# Patient Record
Sex: Male | Born: 1955 | Race: White | Hispanic: No | Smoking: Current some day smoker
Health system: Southern US, Community
[De-identification: ages and names within clinical notes are randomized; demographics above are authoritative.]

## PROBLEM LIST (undated history)

## (undated) DIAGNOSIS — K649 Unspecified hemorrhoids: Secondary | ICD-10-CM

## (undated) DIAGNOSIS — K5792 Diverticulitis of intestine, part unspecified, without perforation or abscess without bleeding: Secondary | ICD-10-CM

## (undated) HISTORY — DX: Diverticulitis of intestine, part unspecified, without perforation or abscess without bleeding: K57.92

## (undated) HISTORY — DX: Unspecified hemorrhoids: K64.9

---

## 1998-07-16 HISTORY — PX: COLONOSCOPY: SHX174

## 2010-07-16 DIAGNOSIS — K649 Unspecified hemorrhoids: Secondary | ICD-10-CM

## 2010-07-16 DIAGNOSIS — K5792 Diverticulitis of intestine, part unspecified, without perforation or abscess without bleeding: Secondary | ICD-10-CM

## 2010-07-16 HISTORY — DX: Diverticulitis of intestine, part unspecified, without perforation or abscess without bleeding: K57.92

## 2010-07-16 HISTORY — DX: Unspecified hemorrhoids: K64.9

## 2011-02-26 ENCOUNTER — Ambulatory Visit: Payer: Self-pay | Admitting: Internal Medicine

## 2011-04-27 ENCOUNTER — Ambulatory Visit: Payer: Self-pay | Admitting: General Surgery

## 2011-04-30 LAB — PATHOLOGY REPORT

## 2012-11-07 ENCOUNTER — Emergency Department: Payer: Self-pay | Admitting: Emergency Medicine

## 2012-11-07 LAB — URINALYSIS, COMPLETE
Bilirubin,UR: NEGATIVE
Leukocyte Esterase: NEGATIVE
Nitrite: NEGATIVE
Ph: 7 (ref 4.5–8.0)
Protein: NEGATIVE
RBC,UR: 1 /HPF (ref 0–5)
WBC UR: 1 /HPF (ref 0–5)

## 2012-11-07 LAB — COMPREHENSIVE METABOLIC PANEL
Albumin: 3.8 g/dL (ref 3.4–5.0)
Alkaline Phosphatase: 99 U/L (ref 50–136)
BUN: 16 mg/dL (ref 7–18)
Calcium, Total: 9 mg/dL (ref 8.5–10.1)
Chloride: 107 mmol/L (ref 98–107)
EGFR (African American): >60
Potassium: 4.2 mmol/L (ref 3.5–5.1)
SGOT(AST): 25 U/L (ref 15–37)
SGPT (ALT): 28 U/L (ref 12–78)
Total Protein: 7.2 g/dL (ref 6.4–8.2)

## 2012-11-07 LAB — CBC
HCT: 47 % (ref 40.0–52.0)
HGB: 15.9 g/dL (ref 13.0–18.0)
Platelet: 238 10*3/uL (ref 150–440)

## 2012-11-07 LAB — LIPASE, BLOOD: Lipase: 139 U/L (ref 73–393)

## 2012-11-12 ENCOUNTER — Encounter: Payer: Self-pay | Admitting: *Deleted

## 2012-12-03 ENCOUNTER — Ambulatory Visit: Payer: Self-pay | Admitting: General Surgery

## 2012-12-11 ENCOUNTER — Encounter: Payer: Self-pay | Admitting: *Deleted

## 2016-10-24 ENCOUNTER — Encounter: Payer: Self-pay | Admitting: General Surgery

## 2017-05-08 ENCOUNTER — Ambulatory Visit
Admission: RE | Admit: 2017-05-08 | Payer: BLUE CROSS/BLUE SHIELD | Source: Ambulatory Visit | Admitting: Unknown Physician Specialty

## 2017-05-08 ENCOUNTER — Encounter: Admission: RE | Payer: Self-pay | Source: Ambulatory Visit

## 2017-05-08 SURGERY — COLONOSCOPY WITH PROPOFOL
Anesthesia: General

## 2017-09-03 ENCOUNTER — Telehealth: Payer: Self-pay | Admitting: *Deleted

## 2017-09-03 DIAGNOSIS — Z122 Encounter for screening for malignant neoplasm of respiratory organs: Secondary | ICD-10-CM

## 2017-09-03 DIAGNOSIS — Z87891 Personal history of nicotine dependence: Secondary | ICD-10-CM

## 2017-09-03 NOTE — Telephone Encounter (Signed)
Received referral for initial lung cancer screening scan. Contacted patient and obtained smoking history,(former, quit 1 year ago, 33 pack year) as well as answering questions related to screening process. Patient denies signs of lung cancer such as weight loss or hemoptysis. Patient denies comorbidity that would prevent curative treatment if lung cancer were found. Patient is scheduled for shared decision making visit and CT scan on 09/24/17.

## 2017-09-24 ENCOUNTER — Ambulatory Visit
Admission: RE | Admit: 2017-09-24 | Discharge: 2017-09-24 | Disposition: A | Discharge status: Home / Self Care | Payer: BLUE CROSS/BLUE SHIELD | Source: Ambulatory Visit | Attending: Oncology | Admitting: Oncology

## 2017-09-24 ENCOUNTER — Inpatient Hospital Stay: Payer: BLUE CROSS/BLUE SHIELD | Attending: Oncology | Admitting: Oncology

## 2017-09-24 ENCOUNTER — Encounter: Payer: Self-pay | Admitting: Oncology

## 2017-09-24 DIAGNOSIS — J439 Emphysema, unspecified: Secondary | ICD-10-CM | POA: Insufficient documentation

## 2017-09-24 DIAGNOSIS — I700 Atherosclerosis of aorta: Secondary | ICD-10-CM | POA: Diagnosis not present

## 2017-09-24 DIAGNOSIS — N200 Calculus of kidney: Secondary | ICD-10-CM | POA: Diagnosis not present

## 2017-09-24 DIAGNOSIS — Z87891 Personal history of nicotine dependence: Secondary | ICD-10-CM

## 2017-09-24 DIAGNOSIS — Z122 Encounter for screening for malignant neoplasm of respiratory organs: Secondary | ICD-10-CM | POA: Insufficient documentation

## 2017-09-24 NOTE — Progress Notes (Signed)
In accordance with CMS guidelines, patient has met eligibility criteria including age, absence of signs or symptoms of lung cancer.  Social History   Tobacco Use  . Smoking status: Former Smoker    Packs/day: 0.75    Years: 44.00    Pack years: 33.00    Last attempt to quit: 2018    Years since quitting: 1.1  Substance Use Topics  . Alcohol use: Not on file  . Drug use: Not on file     A shared decision-making session was conducted prior to the performance of CT scan. This includes one or more decision aids, includes benefits and harms of screening, follow-up diagnostic testing, over-diagnosis, false positive rate, and total radiation exposure.  Counseling on the importance of adherence to annual lung cancer LDCT screening, impact of co-morbidities, and ability or willingness to undergo diagnosis and treatment is imperative for compliance of the program.  Counseling on the importance of continued smoking cessation for former smokers; the importance of smoking cessation for current smokers, and information about tobacco cessation interventions have been given to patient including [place] and [facility] [place] programs.  Written order for lung cancer screening with LDCT has been given to the patient and any and all questions have been answered to the best of my abilities.   Yearly follow up will be coordinated by Burgess Estelle, Thoracic Navigator.  Faythe Casa, NP 09/24/2017 2:48 PM

## 2017-09-25 ENCOUNTER — Encounter: Payer: Self-pay | Admitting: Oncology

## 2017-09-27 ENCOUNTER — Encounter: Payer: Self-pay | Admitting: *Deleted

## 2017-10-21 ENCOUNTER — Other Ambulatory Visit: Payer: Self-pay | Admitting: Nurse Practitioner

## 2017-10-21 DIAGNOSIS — K760 Fatty (change of) liver, not elsewhere classified: Secondary | ICD-10-CM

## 2017-11-04 ENCOUNTER — Ambulatory Visit
Admission: RE | Admit: 2017-11-04 | Discharge: 2017-11-04 | Disposition: A | Discharge status: Home / Self Care | Payer: BLUE CROSS/BLUE SHIELD | Source: Ambulatory Visit | Attending: Nurse Practitioner | Admitting: Nurse Practitioner

## 2017-11-04 DIAGNOSIS — I700 Atherosclerosis of aorta: Secondary | ICD-10-CM | POA: Diagnosis not present

## 2017-11-04 DIAGNOSIS — K760 Fatty (change of) liver, not elsewhere classified: Secondary | ICD-10-CM | POA: Insufficient documentation

## 2017-11-04 DIAGNOSIS — Z8719 Personal history of other diseases of the digestive system: Secondary | ICD-10-CM | POA: Diagnosis present

## 2017-12-16 ENCOUNTER — Ambulatory Visit (INDEPENDENT_AMBULATORY_CARE_PROVIDER_SITE_OTHER): Payer: BLUE CROSS/BLUE SHIELD | Admitting: Vascular Surgery

## 2017-12-16 ENCOUNTER — Encounter (INDEPENDENT_AMBULATORY_CARE_PROVIDER_SITE_OTHER): Payer: Self-pay | Admitting: Vascular Surgery

## 2017-12-16 DIAGNOSIS — I739 Peripheral vascular disease, unspecified: Secondary | ICD-10-CM | POA: Diagnosis not present

## 2017-12-16 DIAGNOSIS — I714 Abdominal aortic aneurysm, without rupture, unspecified: Secondary | ICD-10-CM

## 2017-12-16 DIAGNOSIS — K219 Gastro-esophageal reflux disease without esophagitis: Secondary | ICD-10-CM

## 2017-12-16 DIAGNOSIS — E782 Mixed hyperlipidemia: Secondary | ICD-10-CM

## 2017-12-20 ENCOUNTER — Encounter (INDEPENDENT_AMBULATORY_CARE_PROVIDER_SITE_OTHER): Payer: Self-pay | Admitting: Vascular Surgery

## 2017-12-20 DIAGNOSIS — K219 Gastro-esophageal reflux disease without esophagitis: Secondary | ICD-10-CM | POA: Insufficient documentation

## 2017-12-20 DIAGNOSIS — I714 Abdominal aortic aneurysm, without rupture, unspecified: Secondary | ICD-10-CM | POA: Insufficient documentation

## 2017-12-20 DIAGNOSIS — I739 Peripheral vascular disease, unspecified: Secondary | ICD-10-CM | POA: Insufficient documentation

## 2017-12-20 DIAGNOSIS — E785 Hyperlipidemia, unspecified: Secondary | ICD-10-CM | POA: Insufficient documentation

## 2017-12-20 NOTE — Progress Notes (Signed)
MRN : [id number]  Karl Ross is a 62 y.o. ([birth year]) male who presents with chief complaint of  Chief Complaint  Patient presents with  . New Patient (Initial Visit)    abnormal abdominal ultrasound  .  History of Present Illness:   The patient presents to the office for evaluation of an abdominal aortic aneurysm. The aneurysm was found by screening ultrasound. Patient denies abdominal pain or unusual back pain, no other abdominal complaints.  No history of an acute onset of painful blue discoloration of the toes.     No family history of AAA.   Patient denies amaurosis fugax or TIA symptoms. There is no history of claudication or rest pain symptoms of the lower extremities.  The patient denies angina or shortness of breath.  CT scan shows an AAA that measures 3.10 cm  Current Meds  Medication Sig  . aspirin EC 81 MG tablet Take by mouth.  . Omega-3 Fatty Acids (FISH OIL) 1000 MG CAPS Take by mouth.  . pantoprazole (PROTONIX) 40 MG tablet TAKE 1 TABLET (40 MG TOTAL) BY MOUTH ONCE DAILY.  . pravastatin (PRAVACHOL) 20 MG tablet Take by mouth.  . tadalafil (CIALIS) 20 MG tablet TAKE 1 TABLET BY MOUTH ONCE DAILY AS NEEDED FOR ERECTILE DYSFUNCTION    Past Medical History:  Diagnosis Date  . Diverticulitis 2012  . Hemorrhoids 2012     Social History Social History   Tobacco Use  . Smoking status: Former Smoker    Packs/day: 0.75    Years: 44.00    Pack years: 33.00    Last attempt to quit: 2018    Years since quitting: 1.4  . Smokeless tobacco: Never Used  Substance Use Topics  . Alcohol use: Yes  . Drug use: No    Family History Family History  Problem Relation Age of Onset  . Pancreatic cancer Mother   . Alzheimer's disease Father   . Pneumonia Father   No family history of bleeding/clotting disorders, porphyria or autoimmune disease   No Known Allergies   REVIEW OF SYSTEMS (Negative unless checked)  Constitutional: [] Weight loss  [] Fever   [] Chills Cardiac: [] Chest pain   [] Chest pressure   [] Palpitations   [] Shortness of breath when laying flat   [] Shortness of breath with exertion. Vascular:  [] Pain in legs with walking   [] Pain in legs at rest  [] History of DVT   [] Phlebitis   [] Swelling in legs   [] Varicose veins   [] Non-healing ulcers Pulmonary:   [] Uses home oxygen   [] Productive cough   [] Hemoptysis   [] Wheeze  [] COPD   [] Asthma Neurologic:  [] Dizziness   [] Seizures   [] History of stroke   [] History of TIA  [] Aphasia   [] Vissual changes   [] Weakness or numbness in arm   [] Weakness or numbness in leg Musculoskeletal:   [] Joint swelling   [] Joint pain   [] Low back pain Hematologic:  [] Easy bruising  [] Easy bleeding   [] Hypercoagulable state   [] Anemic Gastrointestinal:  [] Diarrhea   [] Vomiting  [x] Gastroesophageal reflux/heartburn   [] Difficulty swallowing. Genitourinary:  [] Chronic kidney disease   [] Difficult urination  [] Frequent urination   [] Blood in urine Skin:  [] Rashes   [] Ulcers  Psychological:  [] History of anxiety   []  History of major depression.  Physical Examination  Vitals:   12/16/17 1554  BP: 124/78  Pulse: 65  Resp: 17  Weight: 170 lb (77.1 kg)  Height: 6' (1.829 m)   Body mass index is 23.06 kg/m.  Gen: WD/WN, NAD Head: [place]/AT, No temporalis wasting.  Ear/Nose/Throat: Hearing grossly intact, nares w/o erythema or drainage, poor dentition Eyes: PER, EOMI, sclera nonicteric.  Neck: Supple, no masses.  No bruit or JVD.  Pulmonary:  Good air movement, clear to auscultation bilaterally, no use of accessory muscles.  Cardiac: RRR, normal S1, S2, no Murmurs. Vascular: popliteal pulses not enlarged Vessel Right Left  Radial Palpable Palpable  Popliteal Palpable Palpable  PT Palpable Palpable  DP Palpable Palpable  Gastrointestinal: soft, non-distended. No guarding/no peritoneal signs.  Musculoskeletal: M/S 5/5 throughout.  No deformity or atrophy.  Neurologic: CN 2-12 intact. Pain and light touch  intact in extremities.  Symmetrical.  Speech is fluent. Motor exam as listed above. Psychiatric: Judgment intact, Mood & affect appropriate for pt's clinical situation. Dermatologic: No rashes or ulcers noted.  No changes consistent with cellulitis. Lymph : No Cervical lymphadenopathy, no lichenification or skin changes of chronic lymphedema.  CBC Lab Results  Component Value Date   WBC 9.8 11/07/2012   HGB 15.9 11/07/2012   HCT 47.0 11/07/2012   MCV 95 11/07/2012   PLT 238 11/07/2012    BMET    Component Value Date/Time   NA 138 11/07/2012 2028   K 4.2 11/07/2012 2028   CL 107 11/07/2012 2028   CO2 28 11/07/2012 2028   GLUCOSE 103 (H) 11/07/2012 2028   BUN 16 11/07/2012 2028   CREATININE 0.96 11/07/2012 2028   CALCIUM 9.0 11/07/2012 2028   GFRNONAA >60 11/07/2012 2028   GFRAA >60 11/07/2012 2028   CrCl cannot be calculated (Patient's most recent lab result is older than the maximum 21 days allowed.).  COAG No results found for: INR, PROTIME  Radiology No results found.   Assessment/Plan 1. AAA (abdominal aortic aneurysm) without rupture (HCC) No surgery or intervention at this time. The patient has an asymptomatic abdominal aortic aneurysm that is less than 4 cm in maximal diameter.  I have discussed the natural history of abdominal aortic aneurysm and the small risk of rupture for aneurysm less than 5 cm in size.  However, as these small aneurysms tend to enlarge over time, continued surveillance with ultrasound or CT scan is mandatory.  I have also discussed optimizing medical management with hypertension and lipid control and the importance of abstinence from tobacco.  The patient is also encouraged to exercise a minimum of 30 minutes 4 times a week.  Should the patient develop new onset abdominal or back pain or signs of peripheral embolization they are instructed to seek medical attention immediately and to alert the physician providing care that they have an  aneurysm.  The patient voices their understanding. The patient will return in 12 months with an aortic duplex.   2. PAD (peripheral artery disease) (HCC)  Recommend:  The patient has evidence of atherosclerosis of the lower extremities with claudication.  The patient does not voice lifestyle limiting changes at this point in time.  Noninvasive studies do not suggest clinically significant change.  No invasive studies, angiography or surgery at this time The patient should continue walking and begin a more formal exercise program.  The patient should continue antiplatelet therapy and aggressive treatment of the lipid abnormalities  No changes in the patient's medications at this time  The patient should continue wearing graduated compression socks 10-15 mmHg strength to control the mild edema.    3. Mixed hyperlipidemia Continue statin as ordered and reviewed, no changes at this time   4. Gastroesophageal reflux disease, esophagitis  presence not specified Continue antihypertensive medications as already ordered, these medications have been reviewed and there are no changes at this time.  Avoidence of caffeine and alcohol  Moderate elevation of the head of the bed     Levora DredgeGregory Denis Carreon, MD  12/20/2017 5:44 AM

## 2018-09-13 ENCOUNTER — Telehealth: Payer: Self-pay

## 2018-09-13 NOTE — Telephone Encounter (Signed)
Call pt regarding lung screening. Left message to return call. 

## 2018-09-17 ENCOUNTER — Telehealth: Payer: Self-pay | Admitting: *Deleted

## 2018-09-17 DIAGNOSIS — Z122 Encounter for screening for malignant neoplasm of respiratory organs: Secondary | ICD-10-CM

## 2018-09-17 DIAGNOSIS — Z87891 Personal history of nicotine dependence: Secondary | ICD-10-CM

## 2018-09-17 NOTE — Telephone Encounter (Signed)
Patient has been notified that annual lung cancer screening low dose CT scan is due currently or will be in near future. Confirmed that patient is within the age range of 55-77, and asymptomatic, (no signs or symptoms of lung cancer). Patient denies illness that would prevent curative treatment for lung cancer if found. Verified smoking history, (current, 33.5 pack year). The shared decision making visit was done 09/24/17. Patient is agreeable for CT scan being scheduled.

## 2018-09-18 ENCOUNTER — Encounter: Payer: Self-pay | Admitting: *Deleted

## 2018-09-22 ENCOUNTER — Encounter: Payer: Self-pay | Admitting: *Deleted

## 2018-10-29 ENCOUNTER — Ambulatory Visit: Payer: BLUE CROSS/BLUE SHIELD

## 2018-12-16 ENCOUNTER — Telehealth: Payer: Self-pay | Admitting: *Deleted

## 2018-12-16 DIAGNOSIS — Z122 Encounter for screening for malignant neoplasm of respiratory organs: Secondary | ICD-10-CM

## 2018-12-16 DIAGNOSIS — Z87891 Personal history of nicotine dependence: Secondary | ICD-10-CM

## 2018-12-16 NOTE — Telephone Encounter (Signed)
Patient has been notified that annual lung cancer screening low dose CT scan is due currently or will be in near future. Confirmed that patient is within the age range of 55-77, and asymptomatic, (no signs or symptoms of lung cancer). Patient denies illness that would prevent curative treatment for lung cancer if found. Verified smoking history, (current, 33.5 pack year). The shared decision making visit was done 09/24/17. Patient is agreeable for CT scan being scheduled.

## 2018-12-23 ENCOUNTER — Other Ambulatory Visit: Payer: Self-pay

## 2018-12-23 ENCOUNTER — Ambulatory Visit
Admission: RE | Admit: 2018-12-23 | Discharge: 2018-12-23 | Disposition: A | Discharge status: Home / Self Care | Payer: Medicare Other | Source: Ambulatory Visit | Attending: Oncology | Admitting: Oncology

## 2018-12-23 DIAGNOSIS — Z87891 Personal history of nicotine dependence: Secondary | ICD-10-CM

## 2018-12-23 DIAGNOSIS — Z122 Encounter for screening for malignant neoplasm of respiratory organs: Secondary | ICD-10-CM | POA: Insufficient documentation

## 2018-12-24 ENCOUNTER — Encounter: Payer: Self-pay | Admitting: *Deleted

## 2019-03-12 ENCOUNTER — Ambulatory Visit (INDEPENDENT_AMBULATORY_CARE_PROVIDER_SITE_OTHER): Payer: Medicare Other | Admitting: Vascular Surgery

## 2019-03-12 ENCOUNTER — Encounter (INDEPENDENT_AMBULATORY_CARE_PROVIDER_SITE_OTHER): Payer: Medicare Other

## 2019-07-30 IMAGING — CT CT CHEST LUNG CANCER SCREENING LOW DOSE
2 of 5 series · 15 of 40 positions shown, 18 images · non-contrast
Comparison: 09/24/2017

CLINICAL DATA: 34 pack-year smoking history.  Current smoker.

EXAM:
CT CHEST WITHOUT CONTRAST LOW-DOSE FOR LUNG CANCER SCREENING
TECHNIQUE: Multidetector CT imaging of the chest was performed following the
standard protocol without IV contrast.

[Series 3: lung · axial · 0.70mm/px · z∈[-1291,-959]mm · 12 of 366 slices shown, 15 images (1 of 2)]
[im 17/366  mediastinal]
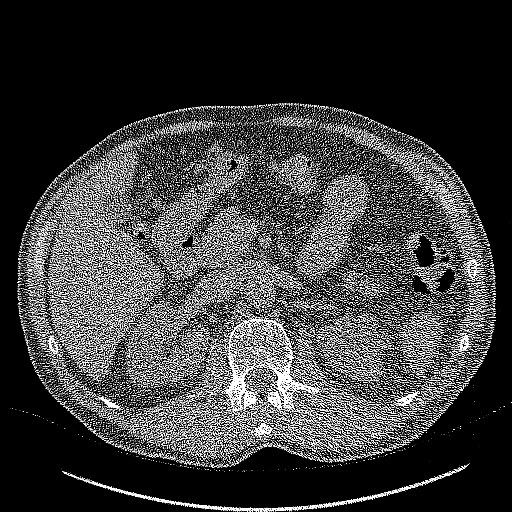
[im 17/366  lung]
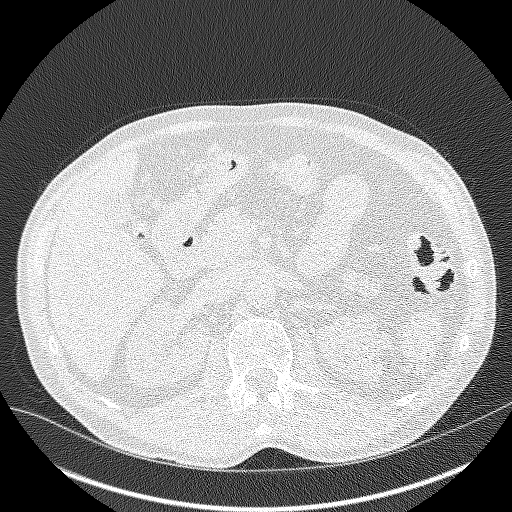
[im 50/366  lung]
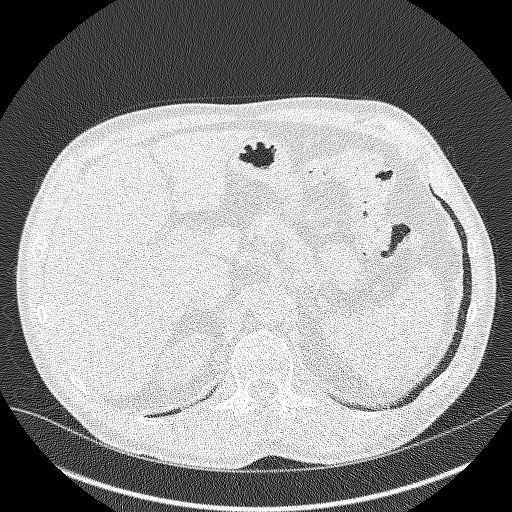
[im 83/366  lung]
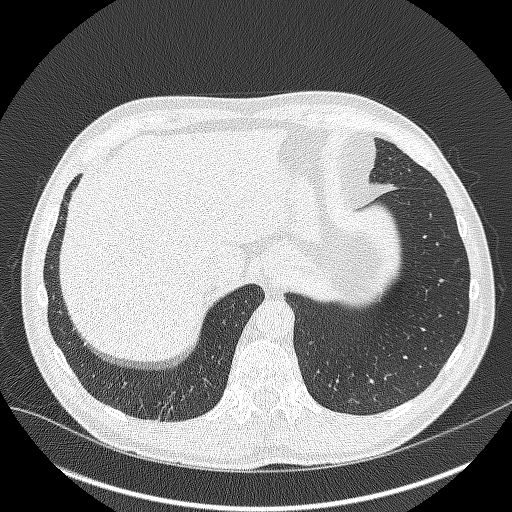
[im 117/366  lung]
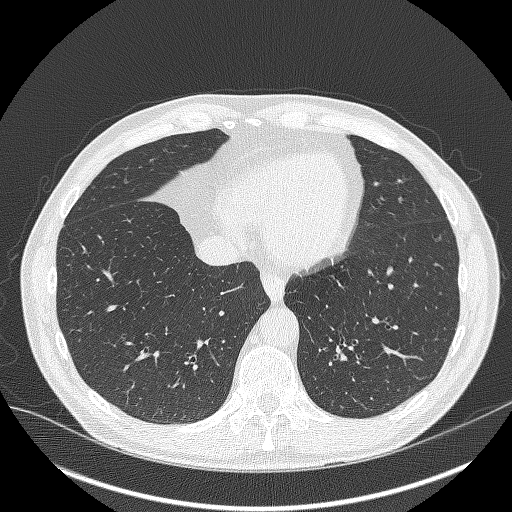
[im 133/366  mediastinal]
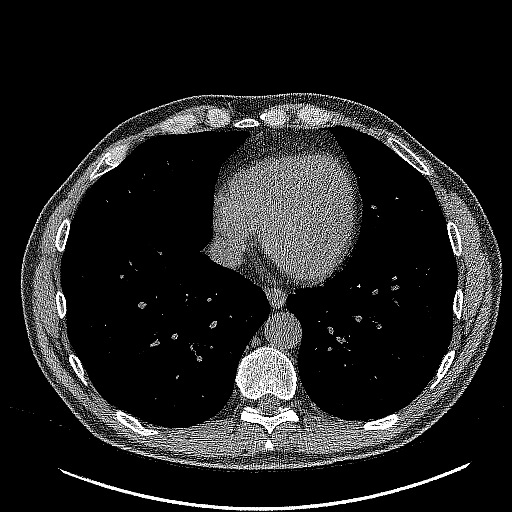
[im 133/366  lung]
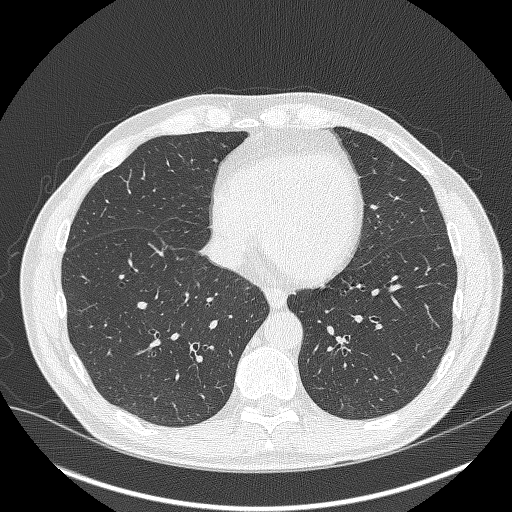
[im 166/366  lung]
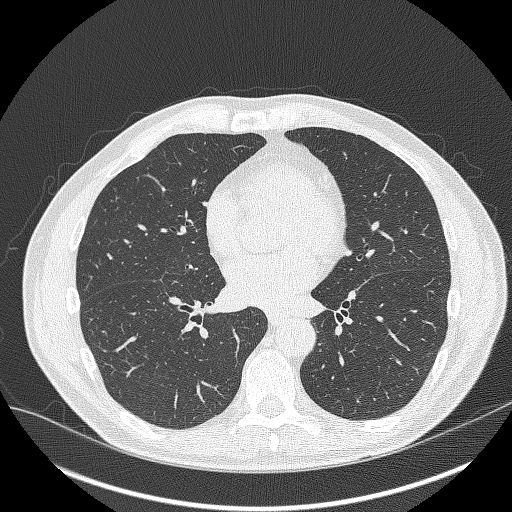
[im 200/366  lung]
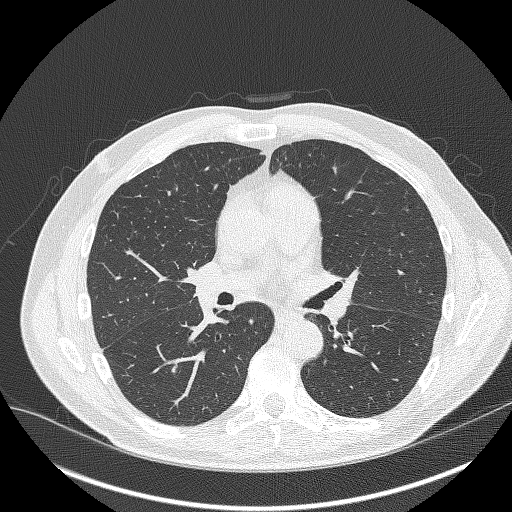
[im 233/366  lung]
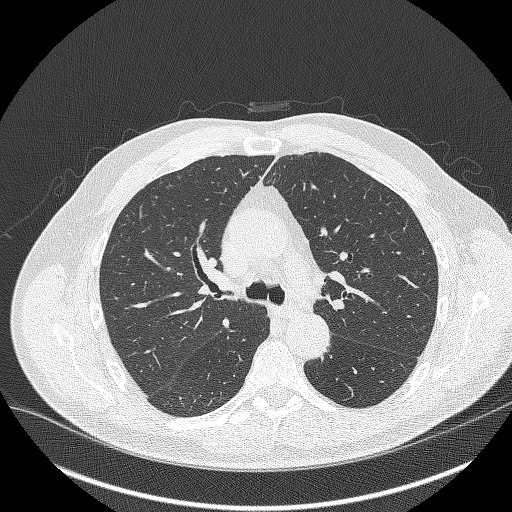
[im 249/366  mediastinal]
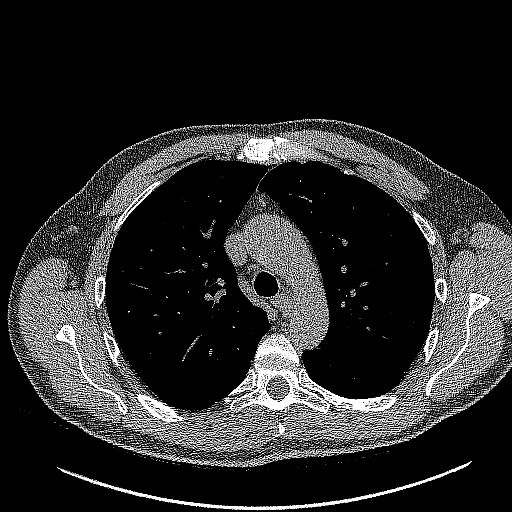
[im 249/366  lung]
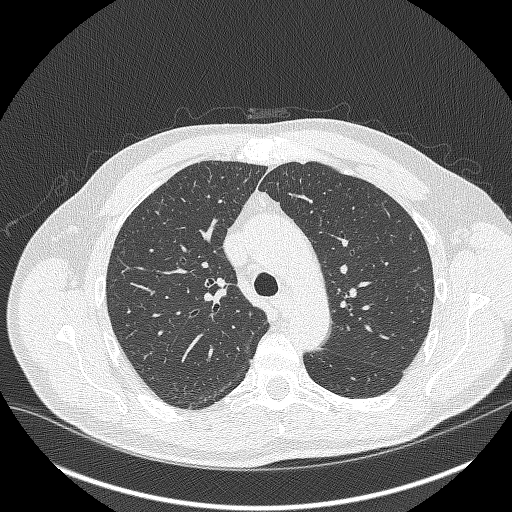
[im 283/366  lung]
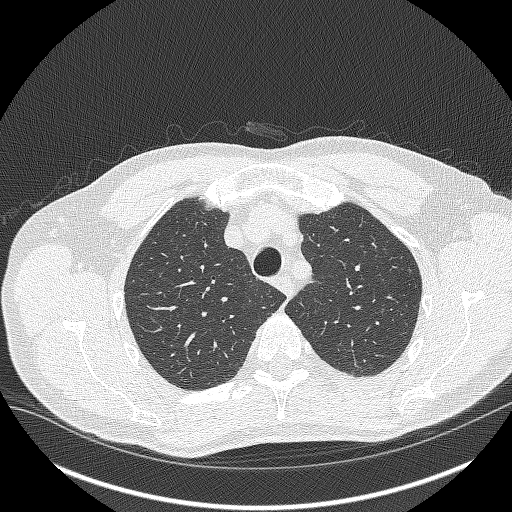
[im 316/366  lung]
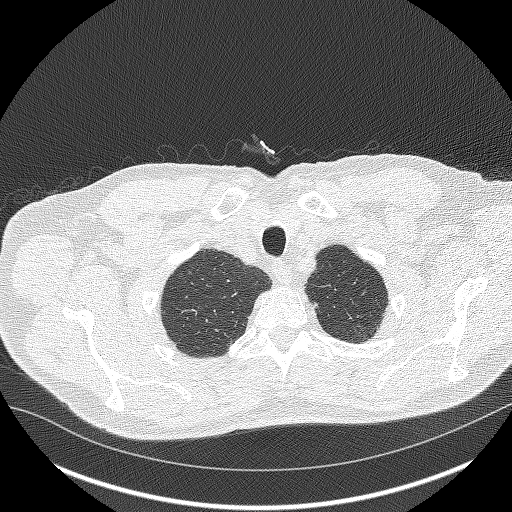
[im 349/366  lung]
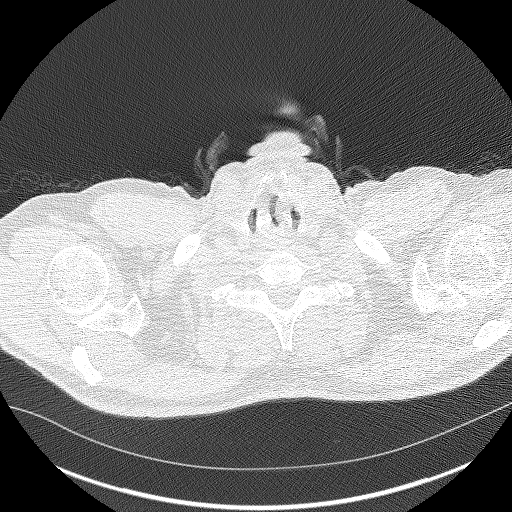

[Series 4: lung · coronal · 0.70mm/px · 3 of 340 slices shown (2 of 2)]
[im 68/340  lung]
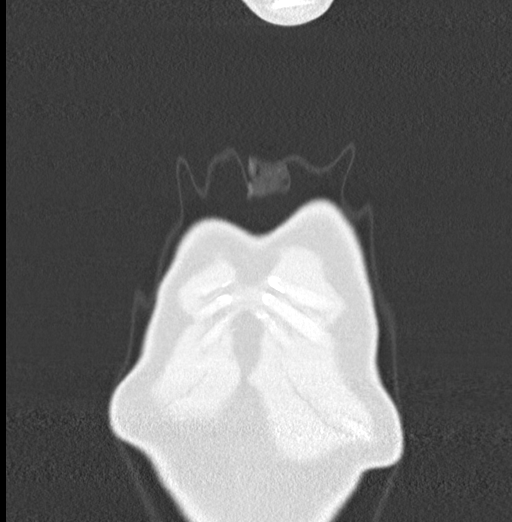
[im 136/340  lung]
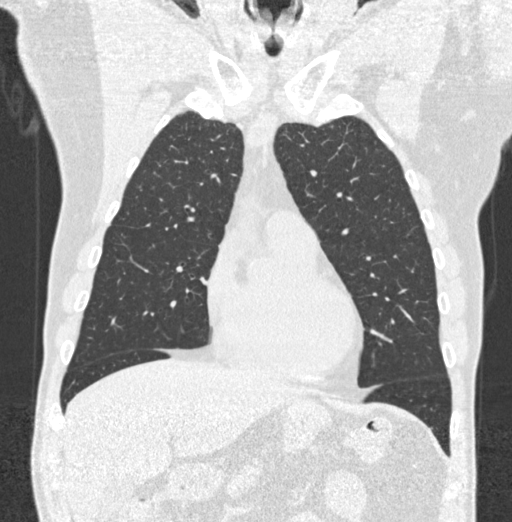
[im 204/340  lung]
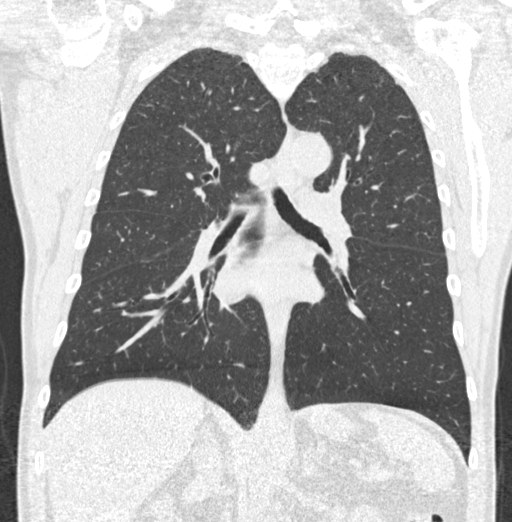

[15 of 40 positions shown; findings below may reference images not displayed]

FINDINGS: Cardiovascular: Aortic atherosclerosis. Tortuous thoracic aorta.
Normal heart size, without pericardial effusion.

Mediastinum/Nodes: No mediastinal or definite hilar adenopathy,
given limitations of unenhanced CT. Tiny hiatal hernia.

Lungs/Pleura: No pleural fluid. Mild centrilobular emphysema. Right
lower lobe scarring.

Isolated right upper lobe pulmonary nodule of volume derived
equivalent diameter 2.4 mm is not significantly changed.

Upper Abdomen: Mild to moderate hepatic steatosis. Normal imaged
portions of the spleen, pancreas, gallbladder, adrenal glands, right
kidney. Dystrophic parenchymal calcification in the upper pole left
kidney.

Musculoskeletal: No acute osseous abnormality.
IMPRESSION: 1. Lung-RADS 2, benign appearance or behavior. Continue annual
screening with low-dose chest CT without contrast in 12 months.
2. Hepatic steatosis.
3. Aortic atherosclerosis (TKW7X-G40.0), coronary artery
atherosclerosis and emphysema (TKW7X-EEP.R).

## 2019-12-21 ENCOUNTER — Telehealth: Payer: Self-pay

## 2019-12-21 NOTE — Telephone Encounter (Signed)
Patient has been notified that the low dose lung cancer screening CT scan is due currently or will be in near future.  Does not want to get CT scan scheduled at this time but would like a return call in about 30 days.

## 2020-01-20 ENCOUNTER — Telehealth: Payer: Self-pay

## 2020-01-20 NOTE — Telephone Encounter (Signed)
Message left notifying patient that it is time to schedule the low dose lung cancer screening CT scan.  Instructed patient to return call to Shawn Perkins at [phone] to verify information prior to CT scan being scheduled.    

## 2020-02-08 ENCOUNTER — Telehealth: Payer: Self-pay

## 2020-02-08 NOTE — Telephone Encounter (Signed)
Message left notifying patient that it is time to schedule the low dose lung cancer screening CT scan.  Instructed patient to return call to Shawn Perkins at [phone] to verify information prior to CT scan being scheduled.    

## 2020-02-16 ENCOUNTER — Telehealth: Payer: Self-pay | Admitting: *Deleted

## 2020-02-16 NOTE — Telephone Encounter (Signed)
Attempted to reach patient to discuss annual low dose lung cancer screening CT scan, but could not reach patient or leave a voicemail.

## 2020-06-01 ENCOUNTER — Encounter: Payer: Self-pay | Admitting: *Deleted

## 2021-04-04 ENCOUNTER — Telehealth: Payer: Medicare Other

## 2021-04-04 NOTE — Telephone Encounter (Signed)
Spoke with pt about scheduling his colonoscopy due to his last one being in 2012. Pt states he thinks he has already had one and will call us back with the information.

## 2022-07-16 ENCOUNTER — Encounter: Payer: Self-pay | Admitting: Emergency Medicine

## 2022-07-16 ENCOUNTER — Ambulatory Visit
Admission: EM | Admit: 2022-07-16 | Discharge: 2022-07-16 | Disposition: A | Discharge status: Home / Self Care | Payer: Medicare Other | Attending: Emergency Medicine | Admitting: Emergency Medicine

## 2022-07-16 DIAGNOSIS — J069 Acute upper respiratory infection, unspecified: Secondary | ICD-10-CM

## 2022-07-16 MED ORDER — BENZONATATE 100 MG PO CAPS: 100.0000 mg | ORAL_CAPSULE | Freq: Three times a day (TID) | ORAL | 0 refills | Status: AC

## 2022-07-16 MED ORDER — AZITHROMYCIN 250 MG PO TABS: 250.0000 mg | ORAL_TABLET | Freq: Every day | ORAL | 0 refills | Status: AC

## 2022-07-16 MED ORDER — PROMETHAZINE-DM 6.25-15 MG/5ML PO SYRP: 5.0000 mL | ORAL_SOLUTION | Freq: Four times a day (QID) | ORAL | 0 refills | Status: AC | PRN

## 2022-07-16 MED ORDER — PREDNISONE 20 MG PO TABS: 40.0000 mg | ORAL_TABLET | Freq: Every day | ORAL | 0 refills | Status: AC

## 2022-07-16 NOTE — ED Provider Notes (Signed)
[facility]    CSN: [id number] Arrival date & time: 07/16/22  1007      History   Chief Complaint Chief Complaint  Patient presents with   Cough    HPI Karl Ross is a 67 y.o. male.   Patient presents for evaluation of nasal congestion, rhinorrhea, sore throat, cough, shortness of breath and wheezing for 7 days.  Associated increased fatigue and malaise.  Cough is worse overnight, interfering with sleep causing awakening every 2-3 hours, has caused episodes of vomiting and difficulty catching her breath after.  Experiencing shortness of breath with exertion and endorses shallow breathing.  Has heard wheezing overnight as well.  No known sick contacts.  Tolerating food and liquids.  Attempted use of multiple over-the-counter cough suppressants and cold and flu medications without relief.  Tobacco use.  Denies respiratory history.  Past Medical History:  Diagnosis Date   Diverticulitis 2012   Hemorrhoids 2012    Patient Active Problem List   Diagnosis Date Noted   AAA (abdominal aortic aneurysm) without rupture ([place]) 12/20/2017   Hyperlipidemia 12/20/2017   GERD (gastroesophageal reflux disease) 12/20/2017   PAD (peripheral artery disease) ([place]) 12/20/2017    Past Surgical History:  Procedure Laterality Date   COLONOSCOPY  2000       Home Medications    Prior to Admission medications   Medication Sig Start Date End Date Taking? Authorizing Provider  aspirin EC 81 MG tablet Take by mouth.   Yes [provider]  Omega-3 Fatty Acids (FISH OIL) 1000 MG CAPS Take by mouth.   Yes [provider]  pantoprazole (PROTONIX) 40 MG tablet TAKE 1 TABLET (40 MG TOTAL) BY MOUTH ONCE DAILY. 06/19/17  Yes [provider]  pravastatin (PRAVACHOL) 20 MG tablet Take by mouth. 10/08/17 07/16/22 Yes [provider]  tadalafil (CIALIS) 20 MG tablet TAKE 1 TABLET BY MOUTH ONCE DAILY AS NEEDED FOR ERECTILE DYSFUNCTION 10/08/17   [provider]    Family History Family History  Problem Relation Age of Onset   Pancreatic cancer Mother    Alzheimer's disease Father    Pneumonia Father     Social History Social History   Tobacco Use   Smoking status: Some Days    Packs/day: 0.75    Years: 44.00    Total pack years: 33.00    Types: Cigarettes    Last attempt to quit: 2018    Years since quitting: 6.0   Smokeless tobacco: Never  Vaping Use   Vaping Use: Never used  Substance Use Topics   Alcohol use: Yes   Drug use: No     Allergies   Patient has no known allergies.   Review of Systems Review of Systems  Constitutional: Negative.   HENT:  Positive for congestion, rhinorrhea and sore throat. Negative for dental problem, drooling, ear discharge, ear pain, facial swelling, hearing loss, mouth sores, nosebleeds, postnasal drip, sinus pressure, sinus pain, sneezing, tinnitus, trouble swallowing and voice change.   Respiratory:  Positive for cough, shortness of breath and wheezing. Negative for apnea, choking, chest tightness and stridor.   Cardiovascular: Negative.   Gastrointestinal: Negative.   Neurological: Negative.      Physical Exam Triage Vital Signs ED Triage Vitals  Enc Vitals Group     BP 07/16/22 1049 117/69     Pulse Rate 07/16/22 1049 93     Resp 07/16/22 1049 16     Temp 07/16/22 1049 98.5 F (36.9 C)  Temp Source 07/16/22 1049 Oral     SpO2 07/16/22 1049 96 %     Weight --      Height --      Head Circumference --      Peak Flow --      Pain Score 07/16/22 1047 0     Pain Loc --      Pain Edu? --      Excl. in [place]? --    No data found.  Updated Vital Signs BP 117/69 (BP Location: Left Arm)   Pulse 93   Temp 98.5 F (36.9 C) (Oral)   Resp 16   SpO2 96%   Visual Acuity Right Eye Distance:   Left Eye Distance:   Bilateral Distance:    Right Eye Near:   Left Eye Near:    Bilateral Near:     Physical Exam Constitutional:      Appearance: Normal  appearance.  HENT:     Right Ear: Tympanic membrane, ear canal and external ear normal.     Left Ear: Tympanic membrane, ear canal and external ear normal.     Nose: Congestion and rhinorrhea present.     Mouth/Throat:     Mouth: Mucous membranes are moist.     Pharynx: Posterior oropharyngeal erythema present.  Cardiovascular:     Rate and Rhythm: Normal rate and regular rhythm.     Pulses: Normal pulses.     Heart sounds: Normal heart sounds.  Pulmonary:     Effort: Pulmonary effort is normal.     Breath sounds: Normal breath sounds.  Skin:    General: Skin is warm and dry.  Neurological:     Mental Status: He is alert and oriented to person, place, and time. Mental status is at baseline.  Psychiatric:        Mood and Affect: Mood normal.        Behavior: Behavior normal.    UC Treatments / Results  Labs (all labs ordered are listed, but only abnormal results are displayed) Labs Reviewed - No data to display  EKG   Radiology No results found.  Procedures Procedures (including critical care time)  Medications Ordered in UC Medications - No data to display  Initial Impression / Assessment and Plan / UC Course  I have reviewed the triage vital signs and the nursing notes.  Pertinent labs & imaging results that were available during my care of the patient were reviewed by me and considered in my medical decision making (see chart for details).  Acute upper respiratory infection  Vitals are stable, patient is in no signs of distress or toxic appearing, stable for outpatient treatment, lungs are clear to auscultation and therefore x-ray imaging deferred as cough is worsening and symptoms been present for 7 days we will provide bacterial coverage, Z-Pak prescribed, prescribed prednisone, Tessalon and Promethazine DM additionally for management of coughing, may continue use of over-the-counter medications as needed with urgent care follow-up as needed Final Clinical  Impressions(s) / UC Diagnoses   Final diagnoses:  None   Discharge Instructions   None    ED Prescriptions   None    PDMP not reviewed this encounter.   Hans Eden, NP 07/16/22 1115

## 2022-07-16 NOTE — ED Triage Notes (Signed)
Pt presents with a cough, congestion and SOB x 1 week.

## 2022-07-16 NOTE — Discharge Instructions (Signed)
This on exam your lungs are clear and you are getting enough air, symptoms are most likely related to an upper airway infection and cough is more exacerbated by irritation to the airway, no lung involvement based on your exam  Begin azithromycin to cover for bacteria  Begin prednisone every morning with food for 5 days,  helps to reduce inflammation and irritation, will help with your shortness of breath no wheezing, cough and abdominal soreness  You may use Tessalon pill every 8 hours to help calm your coughing  You may use cough syrup every 6 hours as needed for additional comfort, be mindful this medication may make you drowsy     You can take Tylenol and/or Ibuprofen as needed for fever reduction and pain relief.   For cough: honey 1/2 to 1 teaspoon (you can dilute the honey in water or another fluid).  You can also use guaifenesin and dextromethorphan for cough. You can use a humidifier for chest congestion and cough.  If you don't have a humidifier, you can sit in the bathroom with the hot shower running.      For sore throat: try warm salt water gargles, cepacol lozenges, throat spray, warm tea or water with lemon/honey, popsicles or ice, or OTC cold relief medicine for throat discomfort.   For congestion: take a daily anti-histamine like Zyrtec, Claritin, and a oral decongestant, such as pseudoephedrine.  You can also use Flonase 1-2 sprays in each nostril daily.   It is important to stay hydrated: drink plenty of fluids (water, gatorade/powerade/pedialyte, juices, or teas) to keep your throat moisturized and help further relieve irritation/discomfort.

## 2023-03-08 ENCOUNTER — Other Ambulatory Visit: Payer: Self-pay | Admitting: Family Medicine

## 2023-03-08 DIAGNOSIS — Z0000 Encounter for general adult medical examination without abnormal findings: Secondary | ICD-10-CM

## 2023-03-08 DIAGNOSIS — Z9189 Other specified personal risk factors, not elsewhere classified: Secondary | ICD-10-CM

## 2023-03-20 ENCOUNTER — Ambulatory Visit
Admission: RE | Admit: 2023-03-20 | Discharge: 2023-03-20 | Disposition: A | Discharge status: Home / Self Care | Payer: Medicare Other | Source: Ambulatory Visit | Attending: Family Medicine | Admitting: Family Medicine

## 2023-03-20 DIAGNOSIS — Z0000 Encounter for general adult medical examination without abnormal findings: Secondary | ICD-10-CM | POA: Insufficient documentation

## 2023-03-20 DIAGNOSIS — Z9189 Other specified personal risk factors, not elsewhere classified: Secondary | ICD-10-CM | POA: Insufficient documentation

## 2023-12-27 ENCOUNTER — Other Ambulatory Visit: Payer: Self-pay | Admitting: Emergency Medicine

## 2023-12-27 DIAGNOSIS — F17210 Nicotine dependence, cigarettes, uncomplicated: Secondary | ICD-10-CM

## 2023-12-27 DIAGNOSIS — Z122 Encounter for screening for malignant neoplasm of respiratory organs: Secondary | ICD-10-CM

## 2024-01-15 ENCOUNTER — Other Ambulatory Visit: Payer: Self-pay | Admitting: Emergency Medicine

## 2024-01-15 DIAGNOSIS — F17210 Nicotine dependence, cigarettes, uncomplicated: Secondary | ICD-10-CM

## 2024-01-15 DIAGNOSIS — Z122 Encounter for screening for malignant neoplasm of respiratory organs: Secondary | ICD-10-CM

## 2024-02-05 ENCOUNTER — Ambulatory Visit
Admission: RE | Admit: 2024-02-05 | Discharge: 2024-02-05 | Disposition: A | Discharge status: Home / Self Care | Source: Ambulatory Visit | Attending: Emergency Medicine | Admitting: Emergency Medicine

## 2024-02-05 DIAGNOSIS — F17210 Nicotine dependence, cigarettes, uncomplicated: Secondary | ICD-10-CM | POA: Insufficient documentation

## 2024-02-05 DIAGNOSIS — Z122 Encounter for screening for malignant neoplasm of respiratory organs: Secondary | ICD-10-CM | POA: Insufficient documentation
